# Patient Record
Sex: Female | Born: 1935 | Race: White | Hispanic: No
Health system: Midwestern US, Academic
[De-identification: ages and names within clinical notes are randomized; demographics above are authoritative.]

---

## 2018-07-15 ENCOUNTER — Encounter: Admit: 2018-07-15 | Discharge: 2018-07-15 | Payer: MEDICARE

## 2018-07-15 DIAGNOSIS — I499 Cardiac arrhythmia, unspecified: Secondary | ICD-10-CM

## 2018-07-15 MED ORDER — GADOBENATE DIMEGLUMINE 529 MG/ML (0.1MMOL/0.2ML) IV SOLN
17 mL | Freq: Once | INTRAVENOUS | 0 refills | Status: CP
Start: 2018-07-15 — End: ?
  Administered 2018-07-16: 06:00:00 17 mL via INTRAVENOUS

## 2018-07-15 MED ORDER — INSULIN ASPART 100 UNIT/ML SC FLEXPEN
0-6 [IU] | Freq: Before meals | SUBCUTANEOUS | 0 refills | Status: DC
Start: 2018-07-15 — End: 2018-07-21
  Administered 2018-07-18: 14:00:00 1 [IU] via SUBCUTANEOUS

## 2018-07-15 MED ORDER — LORAZEPAM 2 MG/ML IJ SOLN
1 mg | Freq: Once | INTRAVENOUS | 0 refills | Status: CP | PRN
Start: 2018-07-15 — End: ?
  Administered 2018-07-16: 05:00:00 1 mg via INTRAVENOUS

## 2018-07-15 MED ORDER — THIAMINE 100MG IVPB
500 mg | Freq: Every day | INTRAVENOUS | 0 refills | Status: DC
Start: 2018-07-15 — End: 2018-07-18
  Administered 2018-07-16 – 2018-07-18 (×8): 500 mg via INTRAVENOUS

## 2018-07-15 MED ORDER — SODIUM CHLORIDE 0.9 % IJ SOLN
50 mL | Freq: Once | INTRAVENOUS | 0 refills | Status: CP
Start: 2018-07-15 — End: ?
  Administered 2018-07-15: 50 mL via INTRAVENOUS

## 2018-07-15 MED ORDER — IOHEXOL 350 MG IODINE/ML IV SOLN
60 mL | Freq: Once | INTRAVENOUS | 0 refills | Status: CP
Start: 2018-07-15 — End: ?
  Administered 2018-07-15: 60 mL via INTRAVENOUS

## 2018-07-16 LAB — BASIC METABOLIC PANEL
Lab: 129 MMOL/L — ABNORMAL LOW (ref 137–147)
Lab: 132 MMOL/L — ABNORMAL LOW (ref >60)

## 2018-07-16 LAB — PHENCYCLIDINES-URINE RANDOM: Lab: NEGATIVE g/dL (ref 32.0–36.0)

## 2018-07-16 LAB — BARBITURATES-URINE RANDOM: Lab: NEGATIVE g/dL (ref 12.0–15.0)

## 2018-07-16 LAB — ALCOHOL LEVEL: Lab: <10 mg/dL — ABNORMAL LOW (ref 24–44)

## 2018-07-16 LAB — COCAINE-URINE RANDOM: Lab: NEGATIVE pg (ref 26–34)

## 2018-07-16 LAB — CBC AND DIFF
Lab: 236 K/UL (ref >60)
Lab: 6 % (ref 4–12)
Lab: 7.2 10*3/uL (ref 4.5–11.0)
Lab: 7.8 FL (ref >60)
Lab: 72 % (ref 41–77)
Lab: 4.1 K/UL — ABNORMAL LOW (ref 4.5–11.0)

## 2018-07-16 LAB — URINALYSIS DIPSTICK REFLEX TO CULTURE
Lab: NEGATIVE
Lab: NEGATIVE
Lab: NEGATIVE
Lab: NEGATIVE
Lab: NEGATIVE
Lab: NEGATIVE

## 2018-07-16 LAB — BENZODIAZEPINES-URINE RANDOM: Lab: POSITIVE % — AB (ref 36–45)

## 2018-07-16 LAB — OPIATES-URINE RANDOM: Lab: NEGATIVE % (ref 11–15)

## 2018-07-16 LAB — LACTIC ACID(LACTATE): Lab: 1.3 MMOL/L (ref 0.5–2.0)

## 2018-07-16 LAB — CANNABINOIDS-URINE RANDOM: Lab: NEGATIVE FL — ABNORMAL HIGH (ref 80–100)

## 2018-07-16 LAB — AMPHETAMINES-URINE RANDOM: Lab: NEGATIVE M/UL — ABNORMAL LOW (ref 4.0–5.0)

## 2018-07-16 LAB — THYROID STIMULATING HORMONE-TSH: Lab: 2 uU/mL (ref 0.35–5.00)

## 2018-07-16 LAB — POC GLUCOSE
Lab: 94 mg/dL (ref >60)
Lab: 82 mg/dL — ABNORMAL HIGH (ref 70–100)
Lab: 114 mg/dL — ABNORMAL HIGH (ref 70–100)

## 2018-07-16 LAB — URINALYSIS MICROSCOPIC REFLEX TO CULTURE

## 2018-07-16 MED ORDER — ENOXAPARIN 40 MG/0.4 ML SC SYRG
40 mg | Freq: Every day | SUBCUTANEOUS | 0 refills | Status: DC
Start: 2018-07-16 — End: 2018-07-21
  Administered 2018-07-17 – 2018-07-21 (×5): 40 mg via SUBCUTANEOUS

## 2018-07-16 MED ORDER — SODIUM CHLORIDE 0.9 % IV SOLP
500 mL | Freq: Once | INTRAVENOUS | 0 refills | Status: CP
Start: 2018-07-16 — End: ?
  Administered 2018-07-16: 22:00:00 500 mL via INTRAVENOUS

## 2018-07-16 MED ORDER — LATANOPROST 0.005 % OP DROP
1 [drp] | Freq: Every evening | OPHTHALMIC | 0 refills | Status: DC
Start: 2018-07-16 — End: 2018-07-21
  Administered 2018-07-17: 03:00:00 1 [drp] via OPHTHALMIC

## 2018-07-16 MED ORDER — DEXTRAN 70-HYPROMELLOSE 0.1-0.3 % OP DROP
1 [drp] | Freq: Four times a day (QID) | OPHTHALMIC | 0 refills | Status: DC
Start: 2018-07-16 — End: 2018-07-21
  Administered 2018-07-16: 21:00:00 1 [drp] via OPHTHALMIC

## 2018-07-16 MED ORDER — METOPROLOL TARTRATE 25 MG PO TAB
12.5 mg | Freq: Two times a day (BID) | ORAL | 0 refills | Status: DC
Start: 2018-07-16 — End: 2018-07-18
  Administered 2018-07-17 – 2018-07-18 (×4): 12.5 mg via ORAL

## 2018-07-16 MED ADMIN — SODIUM CHLORIDE 0.9 % IV SOLP [27838]: 250 mL | INTRAVENOUS | @ 08:00:00 | Stop: 2018-07-16 | NDC 00338004902

## 2018-07-17 ENCOUNTER — Encounter: Admit: 2018-07-17 | Discharge: 2018-07-17 | Payer: MEDICARE

## 2018-07-17 LAB — POC GLUCOSE
Lab: 112 mg/dL — ABNORMAL HIGH (ref 70–100)
Lab: 169 mg/dL — ABNORMAL HIGH (ref 70–100)
Lab: 96 mg/dL (ref 70–100)
Lab: 132 mg/dL — ABNORMAL HIGH (ref 70–100)

## 2018-07-17 LAB — BASIC METABOLIC PANEL: Lab: 134 MMOL/L — ABNORMAL LOW (ref >60)

## 2018-07-17 LAB — CBC AND DIFF: Lab: 3.9 K/UL — ABNORMAL LOW (ref >60)

## 2018-07-17 MED ORDER — PERFLUTREN LIPID MICROSPHERES 1.1 MG/ML IV SUSP
1-20 mL | Freq: Once | INTRAVENOUS | 0 refills | Status: CP | PRN
Start: 2018-07-17 — End: ?
  Administered 2018-07-17: 16:00:00 5 mL via INTRAVENOUS

## 2018-07-17 NOTE — Progress Notes
PHYSICAL THERAPY  ASSESSMENT      Name: Terri Mullen            MRN: [id number]                DOB: [birth year]          Age: 85 y.o.  Admission Date: 07/15/2018             LOS: 2 days      Mobility  Patient Turn/Position: Supine  Progressive Mobility Level: Walk in room  Distance Walked (feet): 10 ft(+10)  Level of Assistance: Assist X2  Assistive Device: Walker  Time Tolerated: 11-30 minutes  Activity Limited By: Weakness;Fatigue    Subjective  Significant hospital events: 85 y.o. female with hypertension and type 2 DM who presented to an OSH on 1/14 after having a generalized tonic-clonic seizure witnessed by an EMT in her dentist office lobby.   Mental / Cognitive Status: Alert;Disoriented;To Place;Cooperative;Inconsistent with Command Following(hard of hearing)  Persons Present: Physical Therapist  Pain: Patient complains of pain;Patient does not rate pain  Pain Location: Left;Foot  Pain Description: Aching  Pain Interventions: Patient agrees to participate in therapy;Patient agrees to participate in therapy with modifications to session  Comments: Pt very hard of hearing. Pt c/o of L foot pain that hurts when she puts weight on it. Pain has been present for about a month.   Ambulation Assist: Independent Mobility at Household Level without Device  Patient Owned Equipment: Single Journalist, newspaper  Home Situation: Lives Alone  Type of Home: House  Entry Stairs: 6-10 Stairs;Ramp  In-Home Stairs: 1-2 Flights of Stairs;Able to Live on One Level  Comments: No family present to verify previous functional mobility or home environment. Per EMR, pt ambulating in home without AD and no recent falls. Family lives next door but cant provide consistent supervision. Family assists with meals.     ROM  LE ROM: WFL    Strength  Overall Strength: Generalized Weakness    Posture/Neurological  Posture: Forward Head;Flexed Trunk;Rounded Shoulders    Bed Mobility/Transfer Comments: Pt sitting up in chair at beginning of therapy session  Transfer Type: Sit to/from Stand  Transfer: Assistance Level: To/From;Bed Side Chair;Toilet;Moderate Assist;Minimal Assist;x2 People;To;Bed  Transfer: Assistive Device: Nurse, adult  Transfers: Type Of Assistance: Materials engineer;For Safety Considerations;Requires Extra Time;For Strength Deficit  End Of Activity Status: In Bed;Nursing Notified;Instructed Patient to Request Assist with Mobility;Instructed Patient to Use Call Light    Balance  Sitting Balance: Static Sitting Balance;No UE Support;Standby Assist  Standing Balance: Static Standing Balance;2 UE support;Minimal Assist;Moderate Assist    Gait  Gait Distance: 10 feet(+10)  Gait: Assistance Level: Moderate Assist;Management of Lines(2nd person for lines and safety)  Gait: Assistive Device: Roller Walker  Gait: Descriptors: Decreased foot clearance RLE;Decreased foot clearance LLE;Pace: Slow;Forward trunk flexion;Step-To Gait;Loss of balance;Decreased step length  Activity Limited By: Patient Choice;Weakness;Complaint of Fatigue;Complaint of Pain    Activity/Exercise  Sit Edge Of Bed: 4 minutes  Sit Edge Of Bed Assist: Stand By Assist(on toilet, I with cleaning)  Stand At Bedside : 1 minutes(at sink to wash hands, 2 large loss of balance)  Stand At Bedside Assist: Minimal Assist;Moderate  Assist    Education  Persons Educated: Patient  Patient Barriers To Learning: Family Not Present;Decreased Hearing  Interventions: Repetition of Instructions  Teaching Methods: Verbal Instruction  Patient Response: Verbalized Understanding;More Instruction Required  Topics: Plan/Goals of PT Interventions;Use of Assistive Device/Orthosis;Mobility Progression;Safety Awareness;Importance of Increasing Activity;Ambulate With Nursing;Equipment Recommendations;Recommend Continued  Therapy    Assessment/Progress  Impaired Mobility Due To: Decreased Strength;Impaired Balance;Safety Concerns;Decreased Activity Tolerance Assessment/Progress: Should Improve w/ Continued PT  Comments: Pt presents with several loss of balance while ambulating to/ from toilet and completing ADLs.     Goals  Goal Formulation: With Patient  Time For Goal Achievement: 3 days, To, 5 days  Patient Will Go Supine To/From Sit: w/ Stand By Assist  Patient Will Transfer Bed/Chair: w/ Minimal Assist  Patient Will Ambulate: 31-50 Feet, w/ Dan Humphreys, w/ Minimal Assist    Plan  Treatment Interventions: Mobility Training;Strengthening  Plan Frequency: 5 Days per Week  PT Plan for Next Visit: progress I with transfers, ambulation, standing balance.     PT Discharge Recommendations  Recommendation: Inpatient setting   If pt unable or unwilling to discharge to inpatient setting to progress standing balance and safety awareness. To maintain safety, therapist recommend pt discharge with consistent supervision/ assistance and wheelchair due to decreased endurance, strength, and stability.   A.  The patient has a mobility limitation that significantly impairs ability to participate in one or more mobility-related activity of daily living (MRADL???s) such as toileting, feeding, dressing, grooming and bathing in customary locations in the home  B. The patient???s mobility limitation cannot be sufficiently resolved by the use of an appropriately fitting cane or walker   C. Use of a manual wheelchair will significantly improve the patient???s ability to participate in MRADL???s and the patient will use it on the regular basis in the home  D. The patient has not expressed an unwillingness to use the manual wheelchair that is provided in the home.  E. The patient has sufficient UE function and other physical and mental capabilities needed to safely propel the manual wheelchair that is provided in the home during a typical day    Wheelchair width recommendation: 20 inches    Patient Currently Requires Physical Assist With: All mobility    Therapist  Romana Juniper, PT  Date  07/17/2018

## 2018-07-18 LAB — POC GLUCOSE
Lab: 111 mg/dL — ABNORMAL HIGH (ref 70–100)
Lab: 207 mg/dL — ABNORMAL HIGH (ref 70–100)
Lab: 121 mg/dL — ABNORMAL HIGH (ref 70–100)
Lab: 178 mg/dL — ABNORMAL HIGH (ref 70–100)

## 2018-07-18 LAB — BASIC METABOLIC PANEL: Lab: 135 MMOL/L — ABNORMAL LOW (ref 137–147)

## 2018-07-18 LAB — CBC AND DIFF: Lab: 3.9 10*3/uL — ABNORMAL LOW (ref >60)

## 2018-07-18 MED ORDER — TRAZODONE 50 MG PO TAB
50 mg | Freq: Every evening | ORAL | 0 refills | Status: DC | PRN
Start: 2018-07-18 — End: 2018-07-21

## 2018-07-18 MED ORDER — POTASSIUM CHLORIDE 20 MEQ PO TBTQ
40 meq | Freq: Once | ORAL | 0 refills | Status: CP
Start: 2018-07-18 — End: ?
  Administered 2018-07-18: 14:00:00 40 meq via ORAL

## 2018-07-18 MED ORDER — MELATONIN 3 MG PO TAB
3 mg | Freq: Every evening | ORAL | 0 refills | Status: DC
Start: 2018-07-18 — End: 2018-07-21
  Administered 2018-07-19 – 2018-07-21 (×3): 3 mg via ORAL

## 2018-07-18 MED ORDER — POTASSIUM CHLORIDE 20 MEQ PO TBTQ
40 meq | Freq: Every day | ORAL | 0 refills | Status: CP
Start: 2018-07-18 — End: ?
  Administered 2018-07-18: 20:00:00 40 meq via ORAL

## 2018-07-18 MED ORDER — THIAMINE HCL (VITAMIN B1) 100 MG PO TAB
100 mg | ORAL_TABLET | Freq: Every day | ORAL | 3 refills | Status: CN
Start: 2018-07-18 — End: ?

## 2018-07-18 MED ORDER — TRAZODONE 50 MG PO TAB
50 mg | ORAL_TABLET | Freq: Every evening | ORAL | 0 refills | Status: CN | PRN
Start: 2018-07-18 — End: ?

## 2018-07-18 MED ORDER — QUETIAPINE 25 MG PO TAB
12.5 mg | Freq: Every evening | ORAL | 0 refills | Status: DC
Start: 2018-07-18 — End: 2018-07-21
  Administered 2018-07-19 – 2018-07-21 (×3): 12.5 mg via ORAL

## 2018-07-18 MED ORDER — SODIUM CHLORIDE 0.9 % IV SOLP
250 mL | INTRAVENOUS | 0 refills | Status: DC
Start: 2018-07-18 — End: 2018-07-19
  Administered 2018-07-18: 14:00:00 250 mL via INTRAVENOUS

## 2018-07-18 MED ORDER — METOPROLOL TARTRATE 25 MG PO TAB
12.5 mg | ORAL_TABLET | Freq: Two times a day (BID) | ORAL | 3 refills | Status: CN
Start: 2018-07-18 — End: ?

## 2018-07-18 MED ORDER — METOPROLOL TARTRATE 25 MG PO TAB
25 mg | Freq: Two times a day (BID) | ORAL | 0 refills | Status: DC
Start: 2018-07-18 — End: 2018-07-21
  Administered 2018-07-19 – 2018-07-21 (×6): 25 mg via ORAL

## 2018-07-18 MED ORDER — MELATONIN 3 MG PO TAB
3 mg | Freq: Every evening | ORAL | 0 refills | Status: DC | PRN
Start: 2018-07-18 — End: 2018-07-18

## 2018-07-19 LAB — POC GLUCOSE
Lab: 111 mg/dL — ABNORMAL HIGH (ref 70–100)
Lab: 96 mg/dL (ref 70–100)
Lab: 128 mg/dL — ABNORMAL HIGH (ref 70–100)

## 2018-07-19 LAB — CBC AND DIFF: Lab: 3 K/UL — ABNORMAL LOW (ref 4.5–11.0)

## 2018-07-19 LAB — BASIC METABOLIC PANEL: Lab: 135 MMOL/L — ABNORMAL LOW (ref 137–147)

## 2018-07-19 MED ORDER — ACETAMINOPHEN 325 MG PO TAB
650 mg | ORAL | 0 refills | Status: DC | PRN
Start: 2018-07-19 — End: 2018-07-21
  Administered 2018-07-20: 16:00:00 650 mg via ORAL

## 2018-07-19 MED ORDER — POTASSIUM CHLORIDE 20 MEQ PO TBTQ
40 meq | Freq: Once | ORAL | 0 refills | Status: CP
Start: 2018-07-19 — End: ?
  Administered 2018-07-19: 16:00:00 40 meq via ORAL

## 2018-07-20 LAB — SODIUM-URINE RANDOM: Lab: 79 MMOL/L

## 2018-07-20 LAB — MAGNESIUM: Lab: 1.6 mg/dL (ref 1.6–2.6)

## 2018-07-20 LAB — OSMOLALITY-URINE RANDOM: Lab: 643 mosm/kg (ref 50–1400)

## 2018-07-20 LAB — CBC
Lab: 12 g/dL (ref 12.0–15.0)
Lab: 14 % — ABNORMAL LOW (ref 11–15)
Lab: 236 10*3/uL (ref >60)
Lab: 29 pg (ref 26–34)
Lab: 3.1 K/UL — ABNORMAL LOW (ref 4.5–11.0)
Lab: 34 g/dL (ref 32.0–36.0)
Lab: 35 % — ABNORMAL LOW (ref 36–45)
Lab: 4.1 M/UL (ref 4.0–5.0)
Lab: 7.2 FL (ref >60)
Lab: 86 FL — ABNORMAL HIGH (ref 80–100)

## 2018-07-20 LAB — POC GLUCOSE
Lab: 118 mg/dL — ABNORMAL HIGH (ref 70–100)
Lab: 119 mg/dL — ABNORMAL HIGH (ref 70–100)
Lab: 126 mg/dL — ABNORMAL HIGH (ref 70–100)
Lab: 136 mg/dL — ABNORMAL HIGH (ref 70–100)

## 2018-07-20 LAB — CREATININE-URINE RANDOM: Lab: 109 mg/dL

## 2018-07-20 LAB — OSMOLALITY: Lab: 288 mosm/kg (ref 280–307)

## 2018-07-20 LAB — BASIC METABOLIC PANEL: Lab: 135 MMOL/L — ABNORMAL LOW (ref 137–147)

## 2018-07-20 MED ORDER — MAGNESIUM SULFATE IN D5W 1 GRAM/100 ML IV PGBK
1 g | Freq: Once | INTRAVENOUS | 0 refills | Status: CP
Start: 2018-07-20 — End: ?
  Administered 2018-07-21: 06:00:00 1 g via INTRAVENOUS

## 2018-07-21 ENCOUNTER — Inpatient Hospital Stay: Admit: 2018-07-17 | Discharge: 2018-07-17 | Payer: MEDICARE

## 2018-07-21 ENCOUNTER — Inpatient Hospital Stay: Admit: 2018-07-15 | Discharge: 2018-07-15 | Payer: MEDICARE

## 2018-07-21 ENCOUNTER — Encounter
Admit: 2018-07-15 | Discharge: 2018-07-21 | Disposition: A | Discharge status: Skilled Nursing Facility | Payer: MEDICARE | Source: Other Acute Inpatient Hospital | Attending: Neurology | Admitting: Neurology

## 2018-07-21 ENCOUNTER — Ambulatory Visit: Admit: 2018-07-15 | Discharge: 2018-07-15 | Payer: MEDICARE

## 2018-07-21 ENCOUNTER — Inpatient Hospital Stay: Admit: 2018-07-19 | Discharge: 2018-07-19 | Payer: MEDICARE

## 2018-07-21 DIAGNOSIS — Z7984 Long term (current) use of oral hypoglycemic drugs: Secondary | ICD-10-CM

## 2018-07-21 DIAGNOSIS — T404X5A Adverse effect of other synthetic narcotics, initial encounter: Secondary | ICD-10-CM

## 2018-07-21 DIAGNOSIS — E871 Hypo-osmolality and hyponatremia: Secondary | ICD-10-CM

## 2018-07-21 DIAGNOSIS — R569 Unspecified convulsions: Secondary | ICD-10-CM

## 2018-07-21 DIAGNOSIS — M25532 Pain in left wrist: Secondary | ICD-10-CM

## 2018-07-21 DIAGNOSIS — Z6835 Body mass index (BMI) 35.0-35.9, adult: Secondary | ICD-10-CM

## 2018-07-21 DIAGNOSIS — I10 Essential (primary) hypertension: Secondary | ICD-10-CM

## 2018-07-21 DIAGNOSIS — M1990 Unspecified osteoarthritis, unspecified site: Secondary | ICD-10-CM

## 2018-07-21 DIAGNOSIS — F419 Anxiety disorder, unspecified: Secondary | ICD-10-CM

## 2018-07-21 DIAGNOSIS — H9190 Unspecified hearing loss, unspecified ear: Secondary | ICD-10-CM

## 2018-07-21 DIAGNOSIS — E861 Hypovolemia: Secondary | ICD-10-CM

## 2018-07-21 DIAGNOSIS — R339 Retention of urine, unspecified: Secondary | ICD-10-CM

## 2018-07-21 DIAGNOSIS — E785 Hyperlipidemia, unspecified: Secondary | ICD-10-CM

## 2018-07-21 DIAGNOSIS — F05 Delirium due to known physiological condition: Secondary | ICD-10-CM

## 2018-07-21 DIAGNOSIS — E876 Hypokalemia: Secondary | ICD-10-CM

## 2018-07-21 DIAGNOSIS — I480 Paroxysmal atrial fibrillation: Secondary | ICD-10-CM

## 2018-07-21 DIAGNOSIS — E119 Type 2 diabetes mellitus without complications: Secondary | ICD-10-CM

## 2018-07-21 LAB — POC GLUCOSE
Lab: 125 mg/dL — ABNORMAL HIGH (ref 70–100)
Lab: 105 mg/dL — ABNORMAL HIGH (ref 70–100)
Lab: 113 mg/dL — ABNORMAL HIGH (ref >60)

## 2018-07-21 LAB — BASIC METABOLIC PANEL
Lab: 136 MMOL/L — ABNORMAL LOW (ref >60)
Lab: 3.8 MMOL/L — ABNORMAL LOW (ref >60)

## 2018-07-21 LAB — CBC: Lab: 2.9 K/UL — ABNORMAL LOW (ref 4.5–11.0)

## 2018-07-21 MED ORDER — DEXTRAN 70-HYPROMELLOSE 0.1-0.3 % OP DROP
1 [drp] | OPHTHALMIC | 0 refills | Status: AC | PRN
Start: 2018-07-21 — End: 2018-07-21

## 2018-07-21 MED ORDER — POTASSIUM CHLORIDE 20 MEQ PO TBTQ
20 meq | Freq: Once | ORAL | 0 refills | Status: CP
Start: 2018-07-21 — End: ?
  Administered 2018-07-21: 18:00:00 20 meq via ORAL

## 2018-07-21 MED ORDER — METOPROLOL TARTRATE 25 MG PO TAB
25 mg | ORAL_TABLET | Freq: Two times a day (BID) | ORAL | 1 refills | 90.00000 days | Status: AC
Start: 2018-07-21 — End: ?

## 2018-07-21 MED ORDER — MELATONIN 3 MG PO TAB
3 mg | ORAL_TABLET | Freq: Every evening | ORAL | 3 refills | 28.00000 days | Status: AC
Start: 2018-07-21 — End: 2018-07-21

## 2018-07-21 MED ORDER — TRAZODONE 50 MG PO TAB
50 mg | ORAL_TABLET | Freq: Every evening | ORAL | 0 refills | Status: AC | PRN
Start: 2018-07-21 — End: ?

## 2018-07-21 MED ORDER — QUETIAPINE 25 MG PO TAB
12.5 mg | ORAL_TABLET | Freq: Every evening | ORAL | 0 refills | Status: AC
Start: 2018-07-21 — End: 2018-07-21

## 2018-07-21 MED ORDER — QUETIAPINE 25 MG PO TAB
12.5 mg | ORAL_TABLET | Freq: Every evening | ORAL | 0 refills | Status: AC
Start: 2018-07-21 — End: ?

## 2018-07-21 MED ORDER — DEXTRAN 70-HYPROMELLOSE 0.1-0.3 % OP DROP
1 [drp] | OPHTHALMIC | 0 refills | Status: AC | PRN
Start: 2018-07-21 — End: ?

## 2018-07-21 MED ORDER — METOPROLOL TARTRATE 25 MG PO TAB
25 mg | ORAL_TABLET | Freq: Two times a day (BID) | ORAL | 1 refills | 90.00000 days | Status: AC
Start: 2018-07-21 — End: 2018-07-21

## 2018-07-21 MED ORDER — MELATONIN 3 MG PO TAB
3 mg | ORAL_TABLET | Freq: Every evening | ORAL | 3 refills | 28.00000 days | Status: AC
Start: 2018-07-21 — End: ?

## 2018-09-05 ENCOUNTER — Encounter: Admit: 2018-09-05 | Discharge: 2018-09-05 | Payer: MEDICARE

## 2018-10-31 DEATH — deceased

## 2019-06-15 IMAGING — CT CT head/brain wo con
3 of 4 series · 14 of 47 positions shown, 16 images · non-contrast
Comparison: none

EXAM

CT head without contast.
INDICATION
SEIZURE, became unresponsive
TECHNIQUE
Volumetric multi detector CT images of the head were obtained without the administration of IV
contrast.
All CT scans at this facility use dose modulation, iterative reconstruction, and/or weight based
dosing when appropriate to reduce radiation dose to as low as reasonably achievable.
COMPARISONS
[None available].
FINDINGS
There is no intra-axial or extra-axial fluid collection. There is no midline shift or mass effect.
There is mild cortical atrophy with sulcal widening and ex vacuo dilatation of the lateral
ventricles. There is mild periventricular hypodensity consistent with chronic small vessel disease
changes. Atherosclerotic calcifications are appreciated in the intracranial portions of the carotid
arteries. The parenchyma is otherwise grossly normal in attenuation with preserved gray-white
differentiation . The visualized paranasal sinuses are clear. The mastoid air cells are well
aerated. The bony calvarium is intact. The orbits and their contents are unremarkable.
IMPRESSION
1. Age related changes described above without acute intracranial abnormality.
CLINICAL HISTORY: SEIZURE, became unresponsive
Tech Notes:
SEIZURE, BECAME UNRESPONSIVE. TJ/ME/RG

[Series 11: brain ax 5.00 hr40 s3 · axial · 0.31mm/px · z∈[-538,-411]mm · 8 of 33 slices shown, 10 images]
[im 3/33  brain]
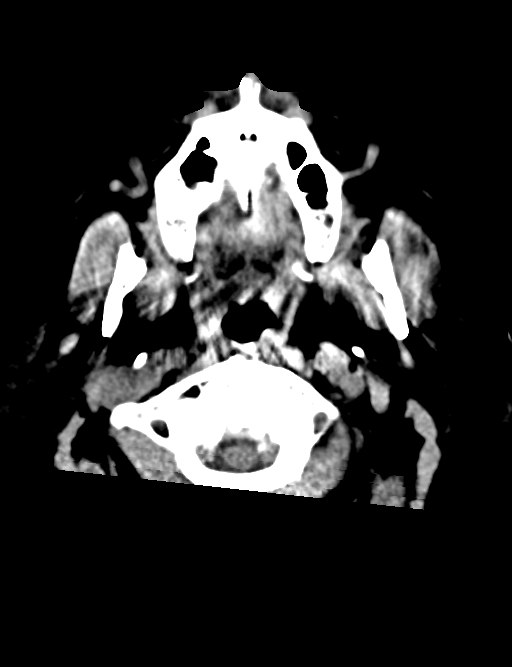
[im 3/33  bone]
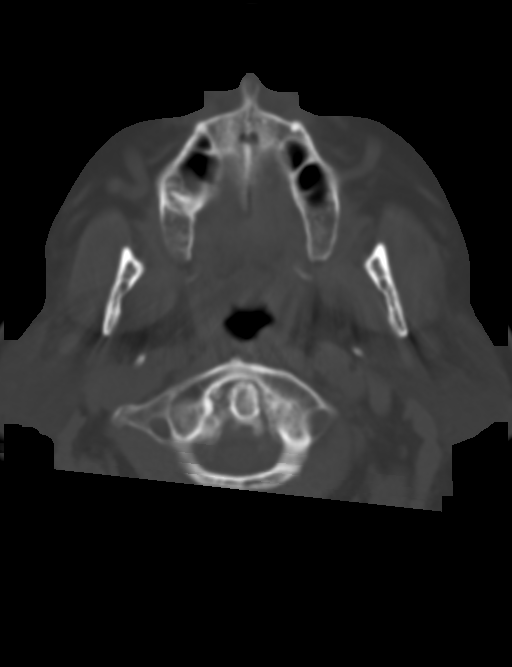
[im 7/33  brain]
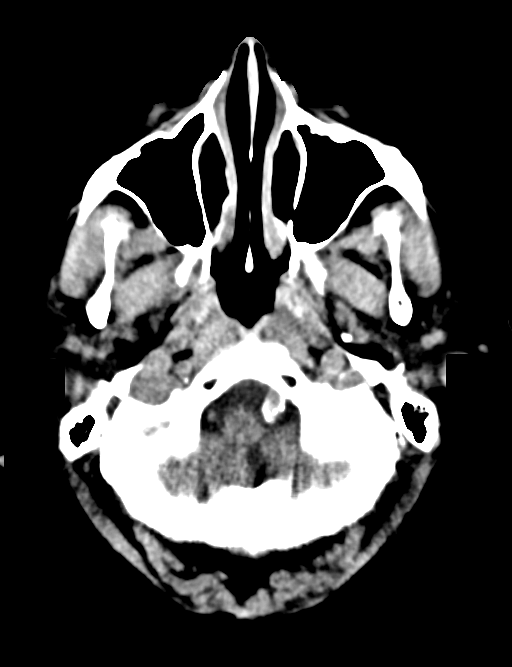
[im 12/33  brain]
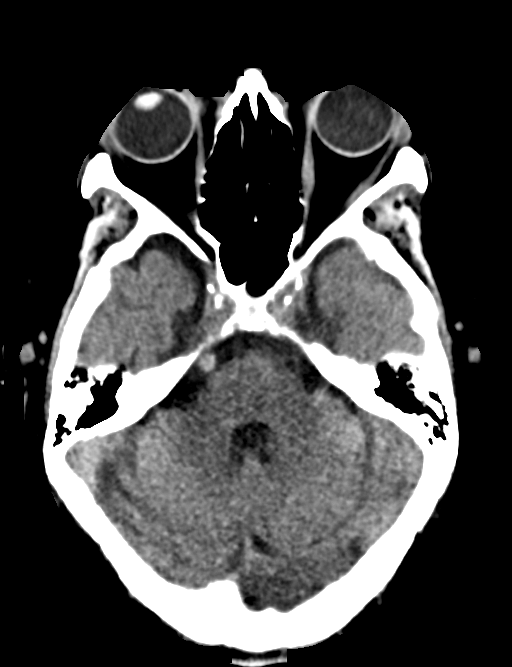
[im 14/33  brain]
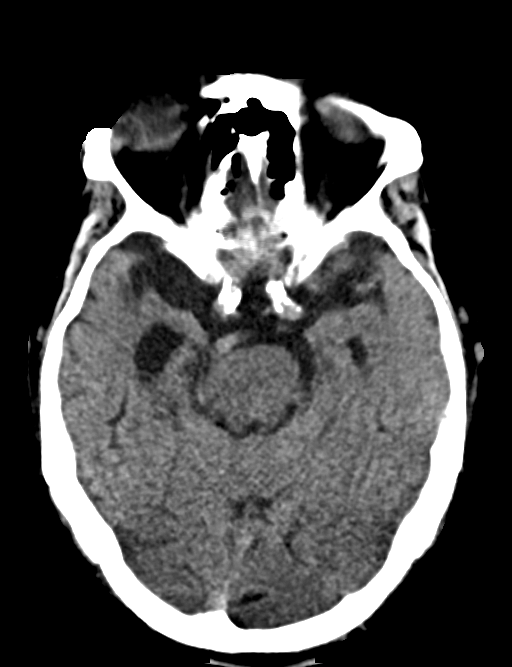
[im 19/33  brain]
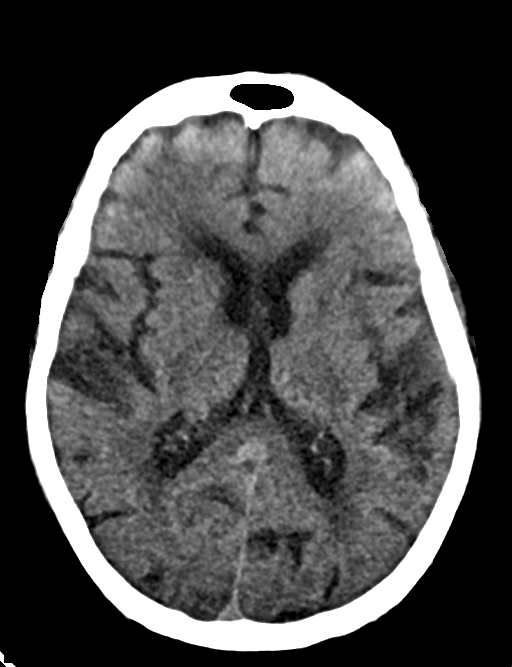
[im 19/33  bone]
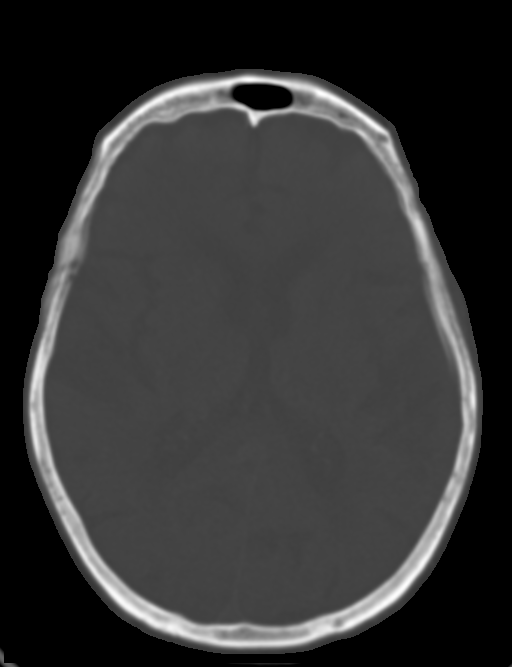
[im 21/33  brain]
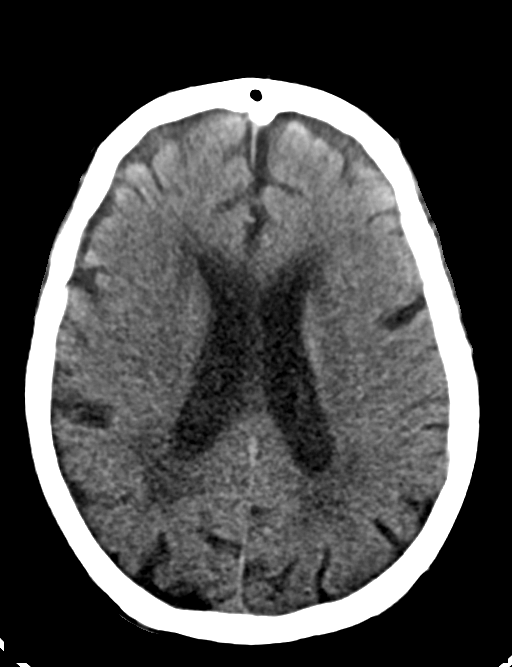
[im 26/33  brain]
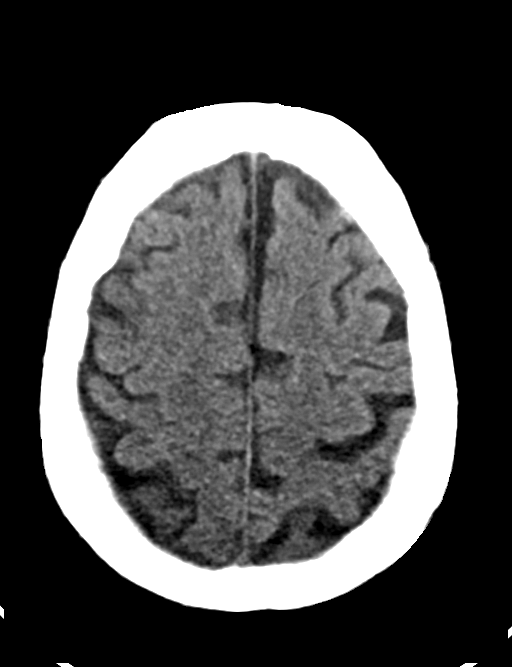
[im 30/33  brain]
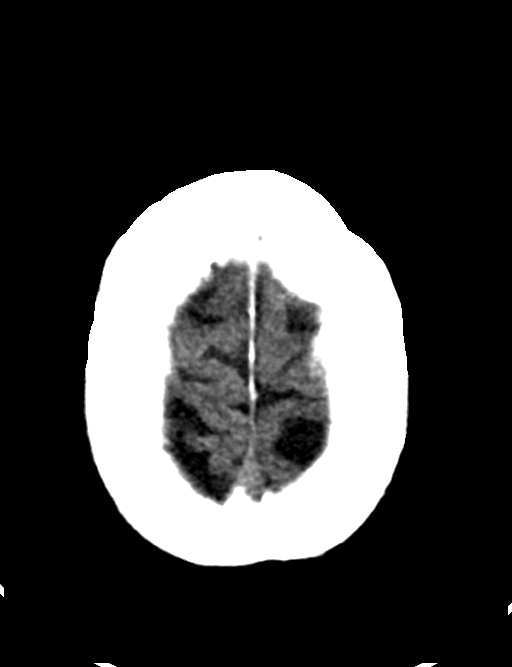

[Series 13: brain cor 5.00 hr40 s3 · coronal · 0.31mm/px · 3 of 41 slices shown]
[im 14/41  brain]
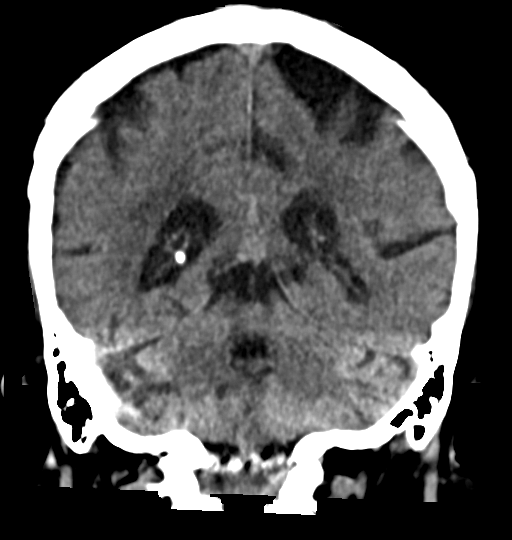
[im 18/41  brain]
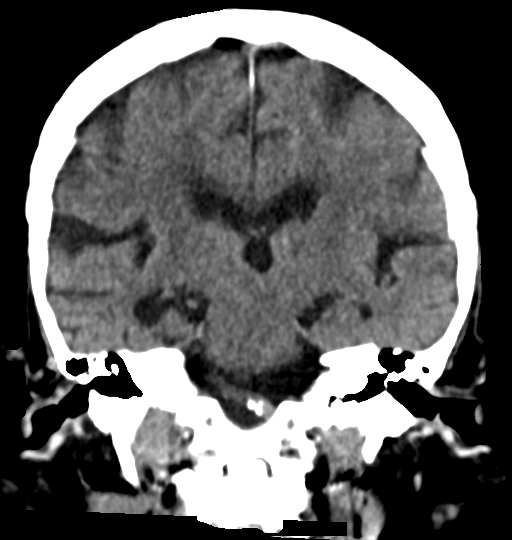
[im 23/41  brain]
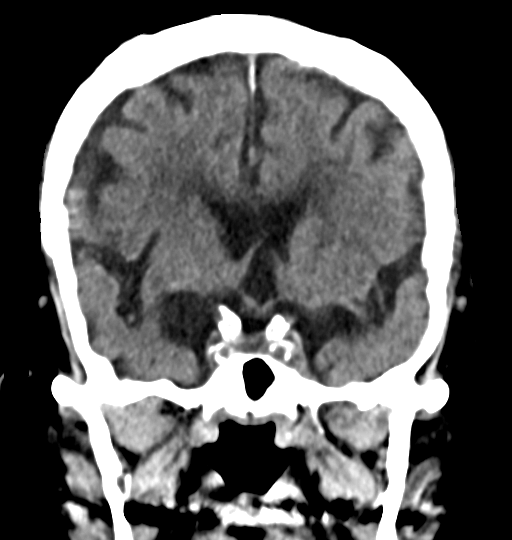

[Series 15: brain sag 5.00 hr40 s3 · sagittal · 0.33mm/px · 3 of 31 slices shown]
[im 11/31  brain]
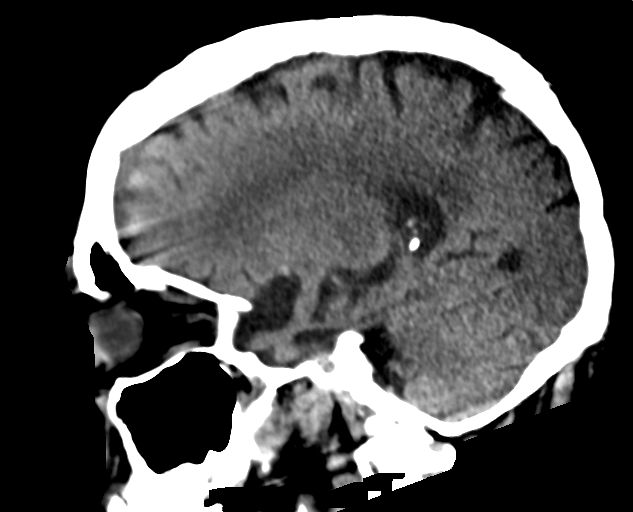
[im 16/31  brain]
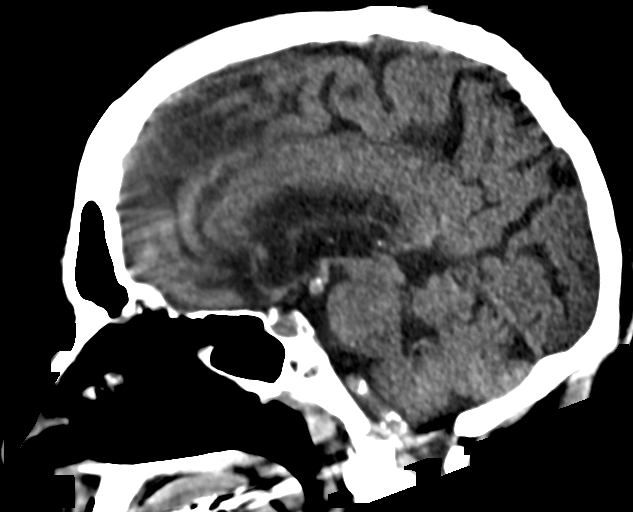
[im 21/31  brain]
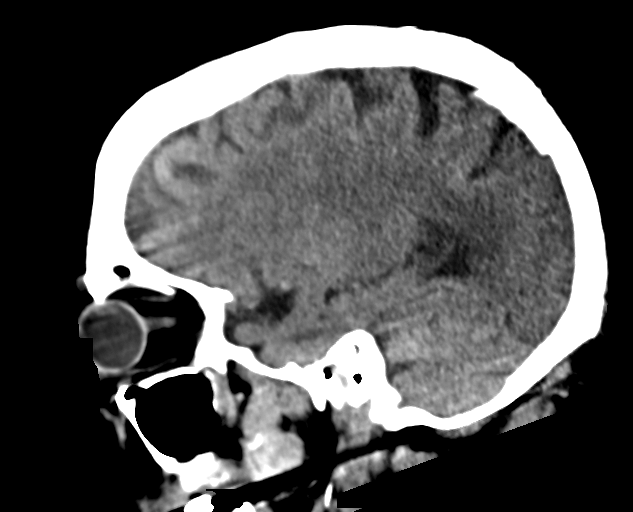

[14 of 47 positions shown; findings below may reference images not displayed]
